# Patient Record
Sex: Female | Born: 1990 | Race: White | Hispanic: No | Marital: Single | Smoking: Never smoker
Health system: Southern US, Community
[De-identification: ages and names within clinical notes are randomized; demographics above are authoritative.]

## PROBLEM LIST (undated history)

## (undated) DIAGNOSIS — R87619 Unspecified abnormal cytological findings in specimens from cervix uteri: Secondary | ICD-10-CM

## (undated) DIAGNOSIS — K219 Gastro-esophageal reflux disease without esophagitis: Secondary | ICD-10-CM

## (undated) DIAGNOSIS — Z8719 Personal history of other diseases of the digestive system: Secondary | ICD-10-CM

## (undated) DIAGNOSIS — K3184 Gastroparesis: Secondary | ICD-10-CM

## (undated) HISTORY — DX: Personal history of other diseases of the digestive system: Z87.19

## (undated) HISTORY — DX: Unspecified abnormal cytological findings in specimens from cervix uteri: R87.619

## (undated) HISTORY — PX: CHOLECYSTECTOMY: SHX55

## (undated) HISTORY — DX: Gastro-esophageal reflux disease without esophagitis: K21.9

## (undated) HISTORY — DX: Gastroparesis: K31.84

---

## 2011-04-02 DIAGNOSIS — K811 Chronic cholecystitis: Secondary | ICD-10-CM | POA: Insufficient documentation

## 2011-04-02 DIAGNOSIS — L03317 Cellulitis of buttock: Secondary | ICD-10-CM | POA: Insufficient documentation

## 2011-04-02 DIAGNOSIS — Z9049 Acquired absence of other specified parts of digestive tract: Secondary | ICD-10-CM

## 2011-04-02 DIAGNOSIS — K589 Irritable bowel syndrome without diarrhea: Secondary | ICD-10-CM | POA: Insufficient documentation

## 2011-04-02 DIAGNOSIS — Z87410 Personal history of cervical dysplasia: Secondary | ICD-10-CM | POA: Insufficient documentation

## 2011-04-02 DIAGNOSIS — G43909 Migraine, unspecified, not intractable, without status migrainosus: Secondary | ICD-10-CM | POA: Insufficient documentation

## 2011-04-02 DIAGNOSIS — Z3400 Encounter for supervision of normal first pregnancy, unspecified trimester: Secondary | ICD-10-CM | POA: Insufficient documentation

## 2011-04-02 DIAGNOSIS — Z23 Encounter for immunization: Secondary | ICD-10-CM | POA: Insufficient documentation

## 2011-04-02 DIAGNOSIS — N10 Acute tubulo-interstitial nephritis: Secondary | ICD-10-CM | POA: Insufficient documentation

## 2011-04-02 DIAGNOSIS — K3184 Gastroparesis: Secondary | ICD-10-CM | POA: Insufficient documentation

## 2011-04-02 DIAGNOSIS — L0231 Cutaneous abscess of buttock: Secondary | ICD-10-CM | POA: Insufficient documentation

## 2011-04-02 DIAGNOSIS — R1011 Right upper quadrant pain: Secondary | ICD-10-CM | POA: Insufficient documentation

## 2011-04-02 DIAGNOSIS — E876 Hypokalemia: Secondary | ICD-10-CM | POA: Insufficient documentation

## 2011-04-02 DIAGNOSIS — N390 Urinary tract infection, site not specified: Secondary | ICD-10-CM | POA: Insufficient documentation

## 2011-04-02 DIAGNOSIS — R112 Nausea with vomiting, unspecified: Secondary | ICD-10-CM | POA: Insufficient documentation

## 2011-04-02 HISTORY — DX: Personal history of cervical dysplasia: Z87.410

## 2011-04-02 HISTORY — DX: Acquired absence of other specified parts of digestive tract: Z90.49

## 2020-06-18 DIAGNOSIS — R300 Dysuria: Secondary | ICD-10-CM | POA: Diagnosis not present

## 2020-06-18 DIAGNOSIS — N926 Irregular menstruation, unspecified: Secondary | ICD-10-CM | POA: Diagnosis not present

## 2020-06-18 DIAGNOSIS — N898 Other specified noninflammatory disorders of vagina: Secondary | ICD-10-CM | POA: Diagnosis not present

## 2020-08-11 ENCOUNTER — Other Ambulatory Visit (HOSPITAL_COMMUNITY)
Admission: RE | Admit: 2020-08-11 | Discharge: 2020-08-11 | Disposition: A | Discharge status: Home / Self Care | Payer: 59 | Source: Ambulatory Visit | Attending: Family Medicine | Admitting: Family Medicine

## 2020-08-11 ENCOUNTER — Other Ambulatory Visit: Payer: Self-pay

## 2020-08-11 ENCOUNTER — Emergency Department
Admission: RE | Admit: 2020-08-11 | Discharge: 2020-08-11 | Disposition: A | Discharge status: Home / Self Care | Payer: 59 | Source: Ambulatory Visit

## 2020-08-11 VITALS — BP 113/71 | HR 61 | Temp 98.8°F | Resp 15

## 2020-08-11 DIAGNOSIS — N760 Acute vaginitis: Secondary | ICD-10-CM

## 2020-08-11 MED ORDER — METRONIDAZOLE 500 MG PO TABS: 500.0000 mg | ORAL_TABLET | Freq: Two times a day (BID) | ORAL | 0 refills | Status: DC

## 2020-08-11 MED ORDER — FLUCONAZOLE 150 MG PO TABS: ORAL_TABLET | ORAL | 0 refills | Status: DC

## 2020-08-11 NOTE — Discharge Instructions (Addendum)
I have sent in metronidazole for you to take twice a day for 7 days.  I have sent in fluconazole in case of yeast. Take one tablet at the onset of symptoms. If symptoms are still present in 3 days, take the second tablet.   Swab will result in about 3 days.  Follow up with this office or with primary care if symptoms are persisting.  Follow up in the ER for high fever, trouble swallowing, trouble breathing, other concerning symptoms.

## 2020-08-11 NOTE — ED Triage Notes (Signed)
Self treated for yeast infection 1 week ago w/ monistat - no relief Pt noted a fishy odor - thinks it may be BV

## 2020-08-11 NOTE — ED Provider Notes (Signed)
[facility]   [id number] 08/11/20 Arrival Time: 1154   CC: VAGINAL DISCHARGE  SUBJECTIVE:  Kimberly Allison is a 30 y.o. female who presents with complaints of gradual vaginal discharge that began about 2 weeks ago. Reports that she had some vaginal itching as well. Reports that she used monistat with some symptom resolution. Reports that she cycles back and forth between BV and yeast. States that there is now a fishy odor with vaginal discharge. Denies a precipitating event, recent sexual encounter or recent antibiotic use. Patient is sexually active.  There are not aggravating or alleviating factors. Denies fever, chills, nausea, vomiting, abdominal or pelvic pain, urinary symptoms, vaginal bleeding, dyspareunia, vaginal rashes or lesions.    ROS: As per HPI.  All other pertinent ROS negative.     History reviewed. No pertinent past medical history. History reviewed. No pertinent surgical history. No Known Allergies No current facility-administered medications on file prior to encounter.   No current outpatient medications on file prior to encounter.    Social History   Socioeconomic History  . Marital status: Single    Spouse name: Not on file  . Number of children: Not on file  . Years of education: Not on file  . Highest education level: Not on file  Occupational History  . Not on file  Tobacco Use  . Smoking status: Never Smoker  . Smokeless tobacco: Never Used  Vaping Use  . Vaping Use: Never used  Substance and Sexual Activity  . Alcohol use: Not Currently  . Drug use: Never  . Sexual activity: Yes    Birth control/protection: None  Other Topics Concern  . Not on file  Social History Narrative  . Not on file   Social Determinants of Health   Financial Resource Strain: Not on file  Food Insecurity: Not on file  Transportation Needs: Not on file  Physical Activity: Not on file  Stress: Not on file  Social Connections: Not on file  Intimate  Partner Violence: Not on file   Family History  Problem Relation Age of Onset  . Healthy Mother   . Healthy Father   . Healthy Sister   . Healthy Brother   . Healthy Sister   . Healthy Sister   . Healthy Sister   . Healthy Brother     OBJECTIVE:  Vitals:   08/11/20 1207  BP: 113/71  Pulse: 61  Resp: 15  Temp: 98.8 F (37.1 C)  TempSrc: Oral  SpO2: 98%     General appearance: Alert, NAD, appears stated age Head: NCAT Throat: lips, mucosa, and tongue normal; teeth and gums normal Lungs: CTA bilaterally without adventitious breath sounds Heart: regular rate and rhythm.  Radial pulses 2+ symmetrical bilaterally Back: no CVA tenderness Abdomen: soft, non-tender; bowel sounds normal; no masses or organomegaly; no guarding or rebound tenderness GU: deferred Skin: warm and dry Psychological:  Alert and cooperative. Normal mood and affect.  LABS:  No results found for this or any previous visit.  Labs Reviewed  CERVICOVAGINAL ANCILLARY ONLY    ASSESSMENT & PLAN:  1. Acute vaginitis     Meds ordered this encounter  Medications  . metroNIDAZOLE (FLAGYL) 500 MG tablet    Sig: Take 1 tablet (500 mg total) by mouth 2 (two) times daily.    Dispense:  14 tablet    Refill:  0    Order Specific Question:   Supervising Provider    Answer:   Merrilee Jansky [id number]  .  fluconazole (DIFLUCAN) 150 MG tablet    Sig: Take one tablet at the onset of symptoms. If symptoms are still present 3 days later, take the second tablet.    Dispense:  2 tablet    Refill:  0    Order Specific Question:   Supervising Provider    Answer:   Merrilee Jansky [[id number]]    Pending: Labs Reviewed  CERVICOVAGINAL ANCILLARY ONLY    Cytology self-swab obtained.   We will follow up with you regarding abnormal results Declines HIV/ syphilis testing today Prescribed metronidazole 500 mg twice daily for 7 days (do not take while consuming alcohol and/or if breastfeeding) Prescribed  diflucan 150 mg once daily and then second dose 72 hours later Take medications as prescribed and to completion If tests results are positive, please abstain from sexual activity until you and your partner(s) have been treated Follow up with PCP or [facility] if symptoms persists Return here or go to ER if you have any new or worsening symptoms fever, chills, nausea, vomiting, abdominal or pelvic pain, painful intercourse, persistent symptoms despite treatment Reviewed expectations re: course of current medical issues. Questions answered. Outlined signs and symptoms indicating need for more acute intervention. Patient verbalized understanding. After Visit Summary given.       Moshe Cipro, NP 08/11/20 1249

## 2020-08-14 LAB — CERVICOVAGINAL ANCILLARY ONLY
Bacterial Vaginitis (gardnerella): POSITIVE — AB
Candida Glabrata: NEGATIVE
Candida Vaginitis: NEGATIVE
Chlamydia: NEGATIVE
Comment: NEGATIVE
Comment: NEGATIVE
Comment: NEGATIVE
Comment: NEGATIVE
Comment: NEGATIVE
Comment: NORMAL
Neisseria Gonorrhea: NEGATIVE
Trichomonas: NEGATIVE

## 2020-09-23 ENCOUNTER — Telehealth: Payer: 59 | Admitting: Physician Assistant

## 2020-09-23 ENCOUNTER — Encounter: Payer: Self-pay | Admitting: Physician Assistant

## 2020-09-23 DIAGNOSIS — N760 Acute vaginitis: Secondary | ICD-10-CM | POA: Diagnosis not present

## 2020-09-23 DIAGNOSIS — B9689 Other specified bacterial agents as the cause of diseases classified elsewhere: Secondary | ICD-10-CM

## 2020-09-23 MED ORDER — METRONIDAZOLE 500 MG PO TABS: 500.0000 mg | ORAL_TABLET | Freq: Two times a day (BID) | ORAL | 0 refills | Status: DC

## 2020-09-23 NOTE — Progress Notes (Signed)
We are sorry that you are not feeling well. Here is how we plan to help! Based on what you shared with me it looks like you: May have a vaginosis due to bacteria  Vaginosis is an inflammation of the vagina that can result in discharge, itching and pain. The cause is usually a change in the normal balance of vaginal bacteria or an infection. Vaginosis can also result from reduced estrogen levels after menopause.  The most common causes of vaginosis are:   Bacterial vaginosis which results from an overgrowth of one on several organisms that are normally present in your vagina.   Yeast infections which are caused by a naturally occurring fungus called candida.   Vaginal atrophy (atrophic vaginosis) which results from the thinning of the vagina from reduced estrogen levels after menopause.   Trichomoniasis which is caused by a parasite and is commonly transmitted by sexual intercourse.  Factors that increase your risk of developing vaginosis include: Marland Kitchen Medications, such as antibiotics and steroids . Uncontrolled diabetes . Use of hygiene products such as bubble bath, vaginal spray or vaginal deodorant . Douching . Wearing damp or tight-fitting clothing . Using an intrauterine device (IUD) for birth control . Hormonal changes, such as those associated with pregnancy, birth control pills or menopause . Sexual activity . Having a sexually transmitted infection  Your treatment plan is Metronidazole or Flagyl 500mg  twice a day for 7 days.  I have electronically sent this prescription into the pharmacy that you have chosen.   To lessen the side effects of antibiotic and hopefully prevent yeast infection, I recommend that you take a daily probiotic while on the antibiotic. We will not treat empirically for yeast infection, but if you develop any symptoms, you may take OTC medication for yeast infection and if symptoms persist, submit an Evisit or follow up with your doctor.   Be sure to take all  of the medication as directed. Stop taking any medication if you develop a rash, tongue swelling or shortness of breath. Mothers who are breast feeding should consider pumping and discarding their breast milk while on these antibiotics. However, there is no consensus that infant exposure at these doses would be harmful.  Remember that medication creams can weaken latex condoms.   HOME CARE:  Good hygiene may prevent some types of vaginosis from recurring and may relieve some symptoms:  . Avoid baths, hot tubs and whirlpool spas. Rinse soap from your outer genital area after a shower, and dry the area well to prevent irritation. Don't use scented or harsh soaps, such as those with deodorant or antibacterial action. Marland Kitchen Avoid irritants. These include scented tampons and pads. . Wipe from front to back after using the toilet. Doing so avoids spreading fecal bacteria to your vagina.  Other things that may help prevent vaginosis include:  Marland Kitchen Don't douche. Your vagina doesn't require cleansing other than normal bathing. Repetitive douching disrupts the normal organisms that reside in the vagina and can actually increase your risk of vaginal infection. Douching won't clear up a vaginal infection. . Use a latex condom. Both female and female latex condoms may help you avoid infections spread by sexual contact. . Wear cotton underwear. Also wear pantyhose with a cotton crotch. If you feel comfortable without it, skip wearing underwear to bed. Yeast thrives in Marland Kitchen Your symptoms should improve in the next day or two.  GET HELP RIGHT AWAY IF:  . You have pain in your lower abdomen ( pelvic  area or over your ovaries) . You develop nausea or vomiting . You develop a fever . Your discharge changes or worsens . You have persistent pain with intercourse . You develop shortness of breath, a rapid pulse, or you faint.  These symptoms could be signs of problems or infections that need to be  evaluated by a medical provider now.  MAKE SURE YOU    Understand these instructions.  Will watch your condition.  Will get help right away if you are not doing well or get worse.  Your e-visit answers were reviewed by a board certified advanced clinical practitioner to complete your personal care plan. Depending upon the condition, your plan could have included both over the counter or prescription medications. Please review your pharmacy choice to make sure that you have choses a pharmacy that is open for you to pick up any needed prescription, Your safety is important to [place]. If you have drug allergies check your prescription carefully.   You can use MyChart to ask questions about today's visit, request a non-urgent call back, or ask for a work or school excuse for 24 hours related to this e-Visit. If it has been greater than 24 hours you will need to follow up with your provider, or enter a new e-Visit to address those concerns. You will get a MyChart message within the next two days asking about your experience. I hope that your e-visit has been valuable and will speed your recovery. I spent 5-10 minutes on review and completion of this note- [facility]

## 2020-10-08 ENCOUNTER — Other Ambulatory Visit: Payer: Self-pay

## 2020-10-09 ENCOUNTER — Encounter: Payer: Self-pay | Admitting: Medical-Surgical

## 2020-10-09 ENCOUNTER — Other Ambulatory Visit: Payer: Self-pay

## 2020-10-09 ENCOUNTER — Ambulatory Visit: Payer: 59 | Admitting: Medical-Surgical

## 2020-10-09 VITALS — BP 99/66 | HR 82 | Temp 98.7°F | Ht 68.25 in | Wt 199.2 lb

## 2020-10-09 DIAGNOSIS — N898 Other specified noninflammatory disorders of vagina: Secondary | ICD-10-CM | POA: Diagnosis not present

## 2020-10-09 DIAGNOSIS — Z30019 Encounter for initial prescription of contraceptives, unspecified: Secondary | ICD-10-CM

## 2020-10-09 DIAGNOSIS — Z7689 Persons encountering health services in other specified circumstances: Secondary | ICD-10-CM

## 2020-10-09 LAB — POCT URINE PREGNANCY: Preg Test, Ur: NEGATIVE

## 2020-10-09 MED ORDER — FLUCONAZOLE 150 MG PO TABS: 150.0000 mg | ORAL_TABLET | Freq: Once | ORAL | 0 refills | Status: AC

## 2020-10-09 MED ORDER — MEDROXYPROGESTERONE ACETATE 150 MG/ML IM SUSP
150.0000 mg | Freq: Once | INTRAMUSCULAR | Status: AC
Start: 2020-10-09 — End: 2020-10-09
  Administered 2020-10-09: 150 mg via INTRAMUSCULAR

## 2020-10-09 NOTE — Progress Notes (Signed)
New Patient Office Visit  Subjective:  Patient ID: Kimberly Allison, female    DOB: 1991/04/23  Age: 30 y.o. MRN: [id number]  CC:  Chief Complaint  Patient presents with  . Establish Care    HPI [name] presents to establish care.  Was recently evaluated for vaginal discharge symptoms.  Was prescribed Flagyl for bacterial vaginosis which she completed as prescribed.  Now she notes her discharge has changed to thick and white and she has developed vaginal itching.  Notes that she does get BV approximately 3 times a year and she always needs Diflucan to treat the yeast infection that she gets after taking Flagyl.  She was taking probiotics and eating yogurt but this did not seem to help.  Birth control-would like to get restarted on Depo-Provera for birth control.  She has done this in the past and tolerated it very well.  She did take 6-year break from Depo-Provera and had the Nexplanon instead.  Unfortunately, when they removed her last Nexplanon, it took almost an hour to get out due to scar tissue.  She is currently sexually active with female partners and uses condoms the majority of the time.  She has been sexually active without a condom since her last menstrual cycle.  History reviewed. No pertinent past medical history.  History reviewed. No pertinent surgical history.  Family History  Problem Relation Age of Onset  . Healthy Mother   . Healthy Father   . Healthy Sister   . Healthy Brother   . Healthy Sister   . Healthy Sister   . Healthy Sister   . Healthy Brother     Social History   Socioeconomic History  . Marital status: Single    Spouse name: Not on file  . Number of children: Not on file  . Years of education: Not on file  . Highest education level: Not on file  Occupational History  . Not on file  Tobacco Use  . Smoking status: Never Smoker  . Smokeless tobacco: Never Used  Vaping Use  . Vaping Use: Never used  Substance and Sexual Activity   . Alcohol use: Yes    Comment: occasionally  . Drug use: Never  . Sexual activity: Yes    Birth control/protection: None  Other Topics Concern  . Not on file  Social History Narrative  . Not on file   Social Determinants of Health   Financial Resource Strain: Not on file  Food Insecurity: Not on file  Transportation Needs: Not on file  Physical Activity: Not on file  Stress: Not on file  Social Connections: Not on file  Intimate Partner Violence: Not on file    ROS Review of Systems  Constitutional: Negative for chills, fatigue, fever and unexpected weight change.  Respiratory: Negative for cough, chest tightness, shortness of breath and wheezing.   Cardiovascular: Negative for chest pain, palpitations and leg swelling.  Gastrointestinal: Negative for abdominal pain, constipation, diarrhea, nausea and vomiting.  Genitourinary: Negative for dysuria, frequency and urgency.  Neurological: Negative for dizziness, light-headedness and headaches.  Psychiatric/Behavioral: Negative for dysphoric mood, self-injury, sleep disturbance and suicidal ideas. The patient is not nervous/anxious.    Objective:   Today's Vitals: BP 99/66   Pulse 82   Temp 98.7 F (37.1 C)   Ht 5' 8.25" (1.734 m)   Wt 199 lb 3.2 oz (90.4 kg)   LMP 09/10/2020   SpO2 95%   BMI 30.07 kg/m   Physical Exam Vitals  reviewed.  Constitutional:      General: She is not in acute distress.    Appearance: Normal appearance.  HENT:     Head: Normocephalic and atraumatic.  Cardiovascular:     Rate and Rhythm: Normal rate and regular rhythm.     Pulses: Normal pulses.     Heart sounds: Normal heart sounds. No murmur heard. No friction rub. No gallop.   Pulmonary:     Effort: Pulmonary effort is normal. No respiratory distress.     Breath sounds: Normal breath sounds. No wheezing.  Skin:    General: Skin is warm and dry.  Neurological:     Mental Status: She is alert and oriented to person, place, and  time.  Psychiatric:        Mood and Affect: Mood normal.        Behavior: Behavior normal.        Thought Content: Thought content normal.        Judgment: Judgment normal.     Assessment & Plan:   1. Encounter to establish care Reviewed available information and discussed healthcare concerns with patient.  She is due for an annual physical exam as well as an update on her Pap smear.  2. Vaginal discharge Symptoms consistent with VBC.  Sending in Diflucan 150 mg x 1.  3. Encounter for female birth control Due for her menses now.  Pregnancy test negative.  Discussed risks versus benefits of starting Depo-Provera without waiting for her menses to start.  Patient would like to go ahead and proceed so Depo-Provera 150 mg IM x1 given today. - POCT urine pregnancy - medroxyPROGESTERone (DEPO-PROVERA) injection 150 mg   Outpatient Encounter Medications as of 10/09/2020  Medication Sig  . fluconazole (DIFLUCAN) 150 MG tablet Take 1 tablet (150 mg total) by mouth once for 1 dose.  . [DISCONTINUED] fluconazole (DIFLUCAN) 150 MG tablet Take one tablet at the onset of symptoms. If symptoms are still present 3 days later, take the second tablet.  . [DISCONTINUED] metroNIDAZOLE (FLAGYL) 500 MG tablet Take 1 tablet (500 mg total) by mouth 2 (two) times daily.  . [EXPIRED] medroxyPROGESTERone (DEPO-PROVERA) injection 150 mg    No facility-administered encounter medications on file as of 10/09/2020.    Follow-up: Return for next Depo-Provera injection in 12-14 weeks, annual physical exam at your convenience.   Thayer Ohm, DNP, APRN, FNP-BC [facility]

## 2020-10-15 ENCOUNTER — Other Ambulatory Visit (HOSPITAL_COMMUNITY): Payer: Self-pay

## 2020-10-18 ENCOUNTER — Other Ambulatory Visit: Payer: Self-pay

## 2020-10-18 ENCOUNTER — Ambulatory Visit (INDEPENDENT_AMBULATORY_CARE_PROVIDER_SITE_OTHER): Payer: 59 | Admitting: Medical-Surgical

## 2020-10-18 ENCOUNTER — Other Ambulatory Visit (HOSPITAL_COMMUNITY)
Admission: RE | Admit: 2020-10-18 | Discharge: 2020-10-18 | Disposition: A | Discharge status: Home / Self Care | Payer: 59 | Source: Ambulatory Visit | Attending: Medical-Surgical | Admitting: Medical-Surgical

## 2020-10-18 ENCOUNTER — Encounter: Payer: Self-pay | Admitting: Medical-Surgical

## 2020-10-18 VITALS — BP 122/69 | HR 64 | Temp 98.7°F | Ht 67.25 in | Wt 198.3 lb

## 2020-10-18 DIAGNOSIS — Z0000 Encounter for general adult medical examination without abnormal findings: Secondary | ICD-10-CM

## 2020-10-18 DIAGNOSIS — Z124 Encounter for screening for malignant neoplasm of cervix: Secondary | ICD-10-CM

## 2020-10-18 DIAGNOSIS — Z1329 Encounter for screening for other suspected endocrine disorder: Secondary | ICD-10-CM | POA: Diagnosis not present

## 2020-10-18 NOTE — Progress Notes (Signed)
HPI: Kimberly Allison is a 30 y.o. female who  has a past medical history of Abnormal Pap smear of cervix, Gastroparesis, GERD (gastroesophageal reflux disease), and History of diverticulitis.  she presents to [facility] today, 10/18/20,  for chief complaint of: Annual physical exam  Dentist: every 6 months, no concerns Eye exam: UTD, glasses and contacts Exercise: some intentional exercise (walking, beachbody) Diet: no special diet Pap smear: doing today COVID vaccine: done, boosted  Concerns:  None  Past medical, surgical, social and family history reviewed:  Patient Active Problem List   Diagnosis Date Noted  . Gastroparesis 04/02/2011  . History of cholecystectomy 04/02/2011  . Irritable bowel syndrome 04/02/2011  . History of cervical dysplasia 04/02/2011    Past Surgical History:  Procedure Laterality Date  . CHOLECYSTECTOMY      Social History   Tobacco Use  . Smoking status: Never Smoker  . Smokeless tobacco: Never Used  Substance Use Topics  . Alcohol use: Yes    Comment: occasionally    Family History  Problem Relation Age of Onset  . Healthy Mother   . Healthy Father   . Healthy Sister   . Healthy Brother   . Healthy Sister   . Healthy Sister   . Healthy Sister   . Healthy Brother   . Hypertension Other      Current medication list and allergy/intolerance information reviewed:    No current outpatient medications on file.   No current facility-administered medications for this visit.    Allergies  Allergen Reactions  . Tape Rash    BANDAIDS      Review of Systems:  Constitutional:  No  fever, no chills, No recent illness, No unintentional weight changes. No significant fatigue.   HEENT: No  headache, no vision change, no hearing change, No sore throat, No  sinus pressure  Cardiac: No  chest pain, No  pressure, No palpitations, No  Orthopnea  Respiratory:  No  shortness of breath. No   Cough  Gastrointestinal: No  abdominal pain, No  nausea, No  vomiting,  No  blood in stool, No  diarrhea, No  constipation   Musculoskeletal: No new myalgia/arthralgia  Skin: No  Rash, No other wounds/concerning lesions  Genitourinary: No  incontinence, No  abnormal genital bleeding, No abnormal genital discharge  Hem/Onc: No  easy bruising/bleeding, No  abnormal lymph node  Endocrine: No cold intolerance,  No heat intolerance. No polyuria/polydipsia/polyphagia   Neurologic: No  weakness, No  dizziness, No  slurred speech/focal weakness/facial droop  Psychiatric: No  concerns with depression, No  concerns with anxiety, No sleep problems, No mood problems  Exam:  BP 122/69   Pulse 64   Temp 98.7 F (37.1 C)   Ht 5' 7.25" (1.708 m)   Wt 198 lb 4.8 oz (89.9 kg)   LMP 10/09/2020   SpO2 97%   BMI 30.83 kg/m   Constitutional: VS see above. General Appearance: alert, well-developed, well-nourished, NAD  Eyes: Normal lids and conjunctive, non-icteric sclera  Ears, Nose, Mouth, Throat: MMM, Normal external inspection ears/nares/mouth/lips/gums. TM normal bilaterally.  Neck: No masses, trachea midline. No thyroid enlargement. No tenderness/mass appreciated. No lymphadenopathy  Respiratory: Normal respiratory effort. no wheeze, no rhonchi, no rales  Cardiovascular: S1/S2 normal, no murmur, no rub/gallop auscultated. RRR. No lower extremity edema. Pedal pulse II/IV bilaterally PT. No carotid bruit or JVD. No abdominal aortic bruit.  Gastrointestinal: Nontender, no masses. No hepatomegaly, no splenomegaly. No hernia appreciated.  Bowel sounds normal. Rectal exam deferred.   Musculoskeletal: Gait normal. No clubbing/cyanosis of digits.   Neurological: Normal balance/coordination. No tremor. No cranial nerve deficit on limited exam. Motor and sensation intact and symmetric. Cerebellar reflexes intact.   Skin: warm, dry, intact. No rash/ulcer. No concerning nevi or subq nodules on  limited exam.    Psychiatric: Normal judgment/insight. Normal mood and affect. Oriented x3.    No results found for this or any previous visit (from the past 72 hour(s)).  No results found.   ASSESSMENT/PLAN:   1. Annual physical exam Checking CBC, CMP, and lipid panel today.  - CBC - COMPLETE METABOLIC PANEL WITH GFR - Lipid panel  2. Screening for cervical cancer Pap smear completed today.  - Cytology - PAP  3. Screening for endocrine disorder Checking TSH. - TSH  Orders Placed This Encounter  Procedures  . CBC  . COMPLETE METABOLIC PANEL WITH GFR  . Lipid panel  . TSH    No orders of the defined types were placed in this encounter.   Patient Instructions   Preventive Care 51-98 Years Old, Female Preventive care refers to lifestyle choices and visits with your health care provider that can promote health and wellness. This includes:  A yearly physical exam. This is also called an annual wellness visit.  Regular dental and eye exams.  Immunizations.  Screening for certain conditions.  Healthy lifestyle choices, such as: ? Eating a healthy diet. ? Getting regular exercise. ? Not using drugs or products that contain nicotine and tobacco. ? Limiting alcohol use. What can I expect for my preventive care visit? Physical exam Your health care provider may check your:  Height and weight. These may be used to calculate your BMI (body mass index). BMI is a measurement that tells if you are at a healthy weight.  Heart rate and blood pressure.  Body temperature.  Skin for abnormal spots. Counseling Your health care provider may ask you questions about your:  Past medical problems.  Family's medical history.  Alcohol, tobacco, and drug use.  Emotional well-being.  Home life and relationship well-being.  Sexual activity.  Diet, exercise, and sleep habits.  Work and work Statistician.  Access to firearms.  Method of birth control.  Menstrual  cycle.  Pregnancy history. What immunizations do I need? Vaccines are usually given at various ages, according to a schedule. Your health care provider will recommend vaccines for you based on your age, medical history, and lifestyle or other factors, such as travel or where you work.   What tests do I need? Blood tests  Lipid and cholesterol levels. These may be checked every 5 years starting at age 70.  Hepatitis C test.  Hepatitis B test. Screening  Diabetes screening. This is done by checking your blood sugar (glucose) after you have not eaten for a while (fasting).  STD (sexually transmitted disease) testing, if you are at risk.  BRCA-related cancer screening. This may be done if you have a family history of breast, ovarian, tubal, or peritoneal cancers.  Pelvic exam and Pap test. This may be done every 3 years starting at age 22. Starting at age 42, this may be done every 5 years if you have a Pap test in combination with an HPV test. Talk with your health care provider about your test results, treatment options, and if necessary, the need for more tests.   Follow these instructions at home: Eating and drinking  Eat a healthy diet that includes  fresh fruits and vegetables, whole grains, lean protein, and low-fat dairy products.  Take vitamin and mineral supplements as recommended by your health care provider.  Do not drink alcohol if: ? Your health care provider tells you not to drink. ? You are pregnant, may be pregnant, or are planning to become pregnant.  If you drink alcohol: ? Limit how much you have to 0-1 drink a day. ? Be aware of how much alcohol is in your drink. In the [place] one drink equals one 12 oz bottle of beer (355 mL), one 5 oz glass of wine (148 mL), or one 1 oz glass of hard liquor (44 mL).   Lifestyle  Take daily care of your teeth and gums. Brush your teeth every morning and night with fluoride toothpaste. Floss one time each day.  Stay active.  Exercise for at least 30 minutes 5 or more days each week.  Do not use any products that contain nicotine or tobacco, such as cigarettes, e-cigarettes, and chewing tobacco. If you need help quitting, ask your health care provider.  Do not use drugs.  If you are sexually active, practice safe sex. Use a condom or other form of protection to prevent STIs (sexually transmitted infections).  If you do not wish to become pregnant, use a form of birth control. If you plan to become pregnant, see your health care provider for a prepregnancy visit.  Find healthy ways to cope with stress, such as: ? Meditation, yoga, or listening to music. ? Journaling. ? Talking to a trusted person. ? Spending time with friends and family. Safety  Always wear your seat belt while driving or riding in a vehicle.  Do not drive: ? If you have been drinking alcohol. Do not ride with someone who has been drinking. ? When you are tired or distracted. ? While texting.  Wear a helmet and other protective equipment during sports activities.  If you have firearms in your house, make sure you follow all gun safety procedures.  Seek help if you have been physically or sexually abused. What's next?  Go to your health care provider once a year for an annual wellness visit.  Ask your health care provider how often you should have your eyes and teeth checked.  Stay up to date on all vaccines. This information is not intended to replace advice given to you by your health care provider. Make sure you discuss any questions you have with your health care provider. Document Revised: 01/22/2020 Document Reviewed: 02/04/2018 Elsevier Patient Education  2021 [place].   Follow-up plan: Return for Depo shot when due (last given 5/3).  Clearnce Sorrel, DNP, APRN, FNP-BC [facility]

## 2020-10-18 NOTE — Patient Instructions (Signed)
Preventive Care 21-30 Years Old, Female Preventive care refers to lifestyle choices and visits with your health care provider that can promote health and wellness. This includes:  A yearly physical exam. This is also called an annual wellness visit.  Regular dental and eye exams.  Immunizations.  Screening for certain conditions.  Healthy lifestyle choices, such as: ? Eating a healthy diet. ? Getting regular exercise. ? Not using drugs or products that contain nicotine and tobacco. ? Limiting alcohol use. What can I expect for my preventive care visit? Physical exam Your health care provider may check your:  Height and weight. These may be used to calculate your BMI (body mass index). BMI is a measurement that tells if you are at a healthy weight.  Heart rate and blood pressure.  Body temperature.  Skin for abnormal spots. Counseling Your health care provider may ask you questions about your:  Past medical problems.  Family's medical history.  Alcohol, tobacco, and drug use.  Emotional well-being.  Home life and relationship well-being.  Sexual activity.  Diet, exercise, and sleep habits.  Work and work environment.  Access to firearms.  Method of birth control.  Menstrual cycle.  Pregnancy history. What immunizations do I need? Vaccines are usually given at various ages, according to a schedule. Your health care provider will recommend vaccines for you based on your age, medical history, and lifestyle or other factors, such as travel or where you work.   What tests do I need? Blood tests  Lipid and cholesterol levels. These may be checked every 5 years starting at age 20.  Hepatitis C test.  Hepatitis B test. Screening  Diabetes screening. This is done by checking your blood sugar (glucose) after you have not eaten for a while (fasting).  STD (sexually transmitted disease) testing, if you are at risk.  BRCA-related cancer screening. This may be  done if you have a family history of breast, ovarian, tubal, or peritoneal cancers.  Pelvic exam and Pap test. This may be done every 3 years starting at age 21. Starting at age 30, this may be done every 5 years if you have a Pap test in combination with an HPV test. Talk with your health care provider about your test results, treatment options, and if necessary, the need for more tests.   Follow these instructions at home: Eating and drinking  Eat a healthy diet that includes fresh fruits and vegetables, whole grains, lean protein, and low-fat dairy products.  Take vitamin and mineral supplements as recommended by your health care provider.  Do not drink alcohol if: ? Your health care provider tells you not to drink. ? You are pregnant, may be pregnant, or are planning to become pregnant.  If you drink alcohol: ? Limit how much you have to 0-1 drink a day. ? Be aware of how much alcohol is in your drink. In the [place] one drink equals one 12 oz bottle of beer (355 mL), one 5 oz glass of wine (148 mL), or one 1 oz glass of hard liquor (44 mL).   Lifestyle  Take daily care of your teeth and gums. Brush your teeth every morning and night with fluoride toothpaste. Floss one time each day.  Stay active. Exercise for at least 30 minutes 5 or more days each week.  Do not use any products that contain nicotine or tobacco, such as cigarettes, e-cigarettes, and chewing tobacco. If you need help quitting, ask your health care provider.  Do not   use drugs.  If you are sexually active, practice safe sex. Use a condom or other form of protection to prevent STIs (sexually transmitted infections).  If you do not wish to become pregnant, use a form of birth control. If you plan to become pregnant, see your health care provider for a prepregnancy visit.  Find healthy ways to cope with stress, such as: ? Meditation, yoga, or listening to music. ? Journaling. ? Talking to a trusted  person. ? Spending time with friends and family. Safety  Always wear your seat belt while driving or riding in a vehicle.  Do not drive: ? If you have been drinking alcohol. Do not ride with someone who has been drinking. ? When you are tired or distracted. ? While texting.  Wear a helmet and other protective equipment during sports activities.  If you have firearms in your house, make sure you follow all gun safety procedures.  Seek help if you have been physically or sexually abused. What's next?  Go to your health care provider once a year for an annual wellness visit.  Ask your health care provider how often you should have your eyes and teeth checked.  Stay up to date on all vaccines. This information is not intended to replace advice given to you by your health care provider. Make sure you discuss any questions you have with your health care provider. Document Revised: 01/22/2020 Document Reviewed: 02/04/2018 Elsevier Patient Education  2021 [employer].

## 2020-10-19 LAB — CBC
HCT: 40.6 % (ref 35.0–45.0)
Hemoglobin: 14 g/dL (ref 11.7–15.5)
MCH: 31.5 pg (ref 27.0–33.0)
MCHC: 34.5 g/dL (ref 32.0–36.0)
MCV: 91.2 fL (ref 80.0–100.0)
MPV: 12.1 fL (ref 7.5–12.5)
Platelets: 234 10*3/uL (ref 140–400)
RBC: 4.45 10*6/uL (ref 3.80–5.10)
RDW: 12 % (ref 11.0–15.0)
WBC: 9.6 10*3/uL (ref 3.8–10.8)

## 2020-10-19 LAB — TSH: TSH: 0.97 mIU/L

## 2020-10-19 LAB — LIPID PANEL
Cholesterol: 164 mg/dL (ref <200)
HDL: 45 mg/dL — ABNORMAL LOW (ref >=50)
LDL Cholesterol (Calc): 97 mg/dL (calc)
Non-HDL Cholesterol (Calc): 119 mg/dL (calc) (ref <130)
Total CHOL/HDL Ratio: 3.6 (calc) (ref <5.0)
Triglycerides: 120 mg/dL (ref <150)

## 2020-10-19 LAB — COMPLETE METABOLIC PANEL WITH GFR
AG Ratio: 1.9 (calc) (ref 1.0–2.5)
ALT: 20 U/L (ref 6–29)
AST: 15 U/L (ref 10–30)
Albumin: 4.4 g/dL (ref 3.6–5.1)
Alkaline phosphatase (APISO): 42 U/L (ref 31–125)
BUN: 11 mg/dL (ref 7–25)
CO2: 24 mmol/L (ref 20–32)
Calcium: 9.5 mg/dL (ref 8.6–10.2)
Chloride: 109 mmol/L (ref 98–110)
Creat: 0.62 mg/dL (ref 0.50–1.10)
GFR, Est African American: 141 mL/min/{1.73_m2} (ref >=60)
GFR, Est Non African American: 122 mL/min/{1.73_m2} (ref >=60)
Globulin: 2.3 g/dL (calc) (ref 1.9–3.7)
Glucose, Bld: 61 mg/dL — ABNORMAL LOW (ref 65–139)
Potassium: 4.2 mmol/L (ref 3.5–5.3)
Sodium: 141 mmol/L (ref 135–146)
Total Bilirubin: 0.4 mg/dL (ref 0.2–1.2)
Total Protein: 6.7 g/dL (ref 6.1–8.1)

## 2020-10-22 LAB — CYTOLOGY - PAP
Diagnosis: NEGATIVE
Diagnosis: REACTIVE

## 2020-10-27 ENCOUNTER — Telehealth: Payer: 59 | Admitting: Physician Assistant

## 2020-10-27 DIAGNOSIS — R3989 Other symptoms and signs involving the genitourinary system: Secondary | ICD-10-CM | POA: Diagnosis not present

## 2020-10-27 MED ORDER — SULFAMETHOXAZOLE-TRIMETHOPRIM 800-160 MG PO TABS: 1.0000 | ORAL_TABLET | Freq: Two times a day (BID) | ORAL | 0 refills | Status: DC

## 2020-10-27 NOTE — Progress Notes (Signed)
We are sorry that you are not feeling well.  Here is how we plan to help!  Based on what you shared with me it looks like you most likely have a simple urinary tract infection.  A UTI (Urinary Tract Infection) is a bacterial infection of the bladder.  Most cases of urinary tract infections are simple to treat but a key part of your care is to encourage you to drink plenty of fluids and watch your symptoms carefully.  I have prescribed Bactrim DS One tablet twice a day for 5 days.  Your symptoms should gradually improve. Call us if the burning in your urine worsens, you develop worsening fever, back pain or pelvic pain or if your symptoms do not resolve after completing the antibiotic.  Urinary tract infections can be prevented by drinking plenty of water to keep your body hydrated.  Also be sure when you wipe, wipe from front to back and don't hold it in!  If possible, empty your bladder every 4 hours.  Your e-visit answers were reviewed by a board certified advanced clinical practitioner to complete your personal care plan.  Depending on the condition, your plan could have included both over the counter or prescription medications.  If there is a problem please reply  once you have received a response from your provider.  Your safety is important to us.  If you have drug allergies check your prescription carefully.    You can use MyChart to ask questions about today's visit, request a non-urgent call back, or ask for a work or school excuse for 24 hours related to this e-Visit. If it has been greater than 24 hours you will need to follow up with your provider, or enter a new e-Visit to address those concerns.   You will get an e-mail in the next two days asking about your experience.  I hope that your e-visit has been valuable and will speed your recovery. Thank you for using e-visits.  I provided 5 minutes of non face-to-face time during this encounter for chart review and documentation.    

## 2020-11-04 ENCOUNTER — Emergency Department (HOSPITAL_COMMUNITY)
Admission: EM | Admit: 2020-11-04 | Discharge: 2020-11-04 | Disposition: A | Discharge status: Home / Self Care | Payer: 59 | Attending: Emergency Medicine | Admitting: Emergency Medicine

## 2020-11-04 ENCOUNTER — Encounter (HOSPITAL_COMMUNITY): Payer: Self-pay

## 2020-11-04 ENCOUNTER — Emergency Department (HOSPITAL_COMMUNITY): Payer: 59

## 2020-11-04 DIAGNOSIS — S61512A Laceration without foreign body of left wrist, initial encounter: Secondary | ICD-10-CM | POA: Insufficient documentation

## 2020-11-04 DIAGNOSIS — X58XXXA Exposure to other specified factors, initial encounter: Secondary | ICD-10-CM | POA: Diagnosis not present

## 2020-11-04 DIAGNOSIS — R69 Illness, unspecified: Secondary | ICD-10-CM | POA: Diagnosis not present

## 2020-11-04 DIAGNOSIS — S6992XA Unspecified injury of left wrist, hand and finger(s), initial encounter: Secondary | ICD-10-CM | POA: Diagnosis present

## 2020-11-04 DIAGNOSIS — M545 Low back pain, unspecified: Secondary | ICD-10-CM | POA: Diagnosis not present

## 2020-11-04 DIAGNOSIS — T63444A Toxic effect of venom of bees, undetermined, initial encounter: Secondary | ICD-10-CM | POA: Diagnosis not present

## 2020-11-04 DIAGNOSIS — Y92410 Unspecified street and highway as the place of occurrence of the external cause: Secondary | ICD-10-CM | POA: Insufficient documentation

## 2020-11-04 DIAGNOSIS — Z041 Encounter for examination and observation following transport accident: Secondary | ICD-10-CM | POA: Diagnosis not present

## 2020-11-04 DIAGNOSIS — S3992XA Unspecified injury of lower back, initial encounter: Secondary | ICD-10-CM | POA: Insufficient documentation

## 2020-11-04 DIAGNOSIS — M542 Cervicalgia: Secondary | ICD-10-CM | POA: Diagnosis not present

## 2020-11-04 DIAGNOSIS — Y999 Unspecified external cause status: Secondary | ICD-10-CM | POA: Diagnosis not present

## 2020-11-04 MED ORDER — METHOCARBAMOL 500 MG PO TABS: 500.0000 mg | ORAL_TABLET | Freq: Two times a day (BID) | ORAL | 0 refills | Status: DC

## 2020-11-04 MED ORDER — NAPROXEN 375 MG PO TABS: 375.0000 mg | ORAL_TABLET | Freq: Two times a day (BID) | ORAL | 0 refills | Status: DC

## 2020-11-04 MED ORDER — ACETAMINOPHEN 325 MG PO TABS
650.0000 mg | ORAL_TABLET | Freq: Once | ORAL | Status: AC
  Administered 2020-11-04: 650 mg via ORAL
  Filled 2020-11-04: qty 2

## 2020-11-04 NOTE — Discharge Instructions (Signed)
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
## 2020-11-11 DIAGNOSIS — Z20822 Contact with and (suspected) exposure to covid-19: Secondary | ICD-10-CM | POA: Diagnosis not present

## 2020-11-20 ENCOUNTER — Other Ambulatory Visit: Payer: Self-pay

## 2020-11-20 ENCOUNTER — Encounter: Payer: Self-pay | Admitting: Medical-Surgical

## 2020-11-20 ENCOUNTER — Ambulatory Visit: Payer: 59 | Admitting: Medical-Surgical

## 2020-11-20 VITALS — BP 118/70 | HR 75 | Temp 97.9°F | Ht 67.25 in | Wt 196.0 lb

## 2020-11-20 DIAGNOSIS — N898 Other specified noninflammatory disorders of vagina: Secondary | ICD-10-CM

## 2020-11-20 DIAGNOSIS — R300 Dysuria: Secondary | ICD-10-CM | POA: Diagnosis not present

## 2020-11-20 LAB — POCT URINALYSIS DIP (CLINITEK)
Bilirubin, UA: NEGATIVE
Blood, UA: NEGATIVE
Glucose, UA: NEGATIVE mg/dL
Ketones, POC UA: NEGATIVE mg/dL
Leukocytes, UA: NEGATIVE
Nitrite, UA: NEGATIVE
POC PROTEIN,UA: NEGATIVE
Spec Grav, UA: 1.025 (ref 1.010–1.025)
Urobilinogen, UA: 0.2 E.U./dL
pH, UA: 5 (ref 5.0–8.0)

## 2020-11-20 LAB — WET PREP FOR TRICH, YEAST, CLUE
MICRO NUMBER:: 12005031
Specimen Quality: ADEQUATE

## 2020-11-20 NOTE — Progress Notes (Signed)
  Subjective:    CC: Vaginal discharge  HPI: Pleasant 30 year old female presenting with complaints of vaginal discharge with mild external itching and a slight odor change.  Notes her symptoms started approximately 2 weeks ago and she was concerned since she had recent antibiotics.  Her discharge is thin and white.  Found it hard to describe the odor but thinks it may be leaning toward that she.  She has a history of BV as well as yeast a couple of times a year.  She is sexually active with 1 female partner and they use condoms.  She is requesting to be tested for STIs.  I reviewed the past medical history, family history, social history, surgical history, and allergies today and no changes were needed.  Please see the problem list section below in epic for further details.  Past Medical History: Past Medical History:  Diagnosis Date   Abnormal Pap smear of cervix    Gastroparesis    GERD (gastroesophageal reflux disease)    History of diverticulitis    Past Surgical History: Past Surgical History:  Procedure Laterality Date   CHOLECYSTECTOMY     Social History: Social History   Socioeconomic History   Marital status: Single    Spouse name: Not on file   Number of children: Not on file   Years of education: Not on file   Highest education level: Not on file  Occupational History   Not on file  Tobacco Use   Smoking status: Never   Smokeless tobacco: Never  Vaping Use   Vaping Use: Never used  Substance and Sexual Activity   Alcohol use: Yes    Comment: occasionally   Drug use: Never   Sexual activity: Yes    Birth control/protection: None  Other Topics Concern   Not on file  Social History Narrative   Not on file   Social Determinants of Health   Financial Resource Strain: Not on file  Food Insecurity: Not on file  Transportation Needs: Not on file  Physical Activity: Not on file  Stress: Not on file  Social Connections: Not on file   Family History: Family  History  Problem Relation Age of Onset   Healthy Mother    Healthy Father    Healthy Sister    Healthy Brother    Healthy Sister    Healthy Sister    Healthy Sister    Healthy Brother    Hypertension Other    Allergies: Allergies  Allergen Reactions   Tape Rash    BANDAIDS   Medications: See med rec.  Review of Systems: See HPI for pertinent positives and negatives.   Objective:    General: Well Developed, well nourished, and in no acute distress.  Neuro: Alert and oriented x3.  HEENT: Normocephalic, atraumatic.  Skin: Warm and dry. Cardiac: Regular rate and rhythm, no murmurs rubs or gallops, no lower extremity edema.  Respiratory: Clear to auscultation bilaterally. Not using accessory muscles, speaking in full sentences.  Impression and Recommendations:    1. Dysuria 2. Vaginal discharge POCT urinalysis negative.  Sending urine for gonorrhea/chlamydia.  Self swab wet prep to evaluate for trichomonas, and BV.   - POCT URINALYSIS DIP (CLINITEK) - Urine Culture - WET PREP FOR TRICH, YEAST, CLUE - C. trachomatis/N. gonorrhoeae RNA  Return if symptoms worsen or fail to improve. ___________________________________________ Thayer Ohm, DNP, APRN, FNP-BC Primary Care and Sports Medicine [facility] [place]

## 2020-11-21 LAB — C. TRACHOMATIS/N. GONORRHOEAE RNA
C. trachomatis RNA, TMA: NOT DETECTED
N. gonorrhoeae RNA, TMA: NOT DETECTED

## 2020-12-24 ENCOUNTER — Other Ambulatory Visit: Payer: Self-pay

## 2020-12-24 ENCOUNTER — Ambulatory Visit (INDEPENDENT_AMBULATORY_CARE_PROVIDER_SITE_OTHER): Payer: 59 | Admitting: Medical-Surgical

## 2020-12-24 VITALS — BP 103/65 | HR 66 | Ht 67.0 in | Wt 196.0 lb

## 2020-12-24 DIAGNOSIS — Z3042 Encounter for surveillance of injectable contraceptive: Secondary | ICD-10-CM

## 2020-12-24 MED ORDER — MEDROXYPROGESTERONE ACETATE 150 MG/ML IM SUSP
150.0000 mg | Freq: Once | INTRAMUSCULAR | Status: AC
  Administered 2020-12-24: 150 mg via INTRAMUSCULAR

## 2020-12-24 NOTE — Progress Notes (Signed)
   Subjective:    Patient ID: Kimberly Allison, female    DOB: Dec 14, 1990, 30 y.o.   MRN: [id number]  HPI Patient is here for a Depo Provera injection. Denies chest pain, shortness of breath, headaches, mood changes, or problems with medication. Per chart, the last injection was on 10/09/2020. Patient was one day early for next injection. Got approval from Christen Butter, DNP to give early.    Review of Systems     Objective:   Physical Exam        Assessment & Plan:  Injection administered in the left deltoid per patient request. Patient tolerated injection well without complications. Patient given date range for her next injection based on the Depo Calendar handout and sent to front desk to schedule for 12 weeks.

## 2021-01-10 ENCOUNTER — Ambulatory Visit: Payer: 59

## 2021-04-02 ENCOUNTER — Ambulatory Visit (INDEPENDENT_AMBULATORY_CARE_PROVIDER_SITE_OTHER): Payer: 59 | Admitting: Physician Assistant

## 2021-04-02 ENCOUNTER — Other Ambulatory Visit: Payer: Self-pay

## 2021-04-02 ENCOUNTER — Encounter: Payer: Self-pay | Admitting: Physician Assistant

## 2021-04-02 ENCOUNTER — Other Ambulatory Visit (HOSPITAL_COMMUNITY): Payer: Self-pay

## 2021-04-02 VITALS — BP 131/76 | HR 66 | Ht 67.0 in | Wt 197.0 lb

## 2021-04-02 DIAGNOSIS — Z3042 Encounter for surveillance of injectable contraceptive: Secondary | ICD-10-CM | POA: Diagnosis not present

## 2021-04-02 DIAGNOSIS — N898 Other specified noninflammatory disorders of vagina: Secondary | ICD-10-CM | POA: Diagnosis not present

## 2021-04-02 LAB — POCT URINE PREGNANCY: Preg Test, Ur: NEGATIVE

## 2021-04-02 MED ORDER — MEDROXYPROGESTERONE ACETATE 150 MG/ML IM SUSP
150.0000 mg | Freq: Once | INTRAMUSCULAR | Status: AC
  Administered 2021-10-23: 150 mg via INTRAMUSCULAR

## 2021-04-02 MED ORDER — METRONIDAZOLE 500 MG PO TABS: 500.0000 mg | ORAL_TABLET | Freq: Two times a day (BID) | ORAL | 0 refills | Status: AC

## 2021-04-02 MED ORDER — MEDROXYPROGESTERONE ACETATE 150 MG/ML IM SUSP
150.0000 mg | INTRAMUSCULAR | 0 refills | Status: DC
  Filled 2021-04-02: qty 1, 90d supply, fill #0

## 2021-04-02 MED ORDER — MEDROXYPROGESTERONE ACETATE 150 MG/ML IM SUSP
150.0000 mg | Freq: Once | INTRAMUSCULAR | Status: DC
  Administered 2021-04-02: 150 mg via INTRAMUSCULAR

## 2021-04-02 NOTE — Progress Notes (Signed)
Subjective:    Patient ID: Kimberly Allison, female    DOB: 1990/11/25, 30 y.o.   MRN: [id number]  HPI Pt is a 30 yo female with vaginal discharge and odor. She has noticed this discharge on and off since her son 11 years ago but worsened since June. In June she came to clinic and STD testing negative as well as wet prep. Discharge has worsened with more odor. Denies any itching or burning. She is embarrassed to have sex with the discharge. Hx of BV in the past. Denies any abdominal or flank pain.   Needs depo shot.   .. Active Ambulatory Problems    Diagnosis Date Noted   Gastroparesis 04/02/2011   History of cholecystectomy 04/02/2011   Irritable bowel syndrome 04/02/2011   History of cervical dysplasia 04/02/2011   Resolved Ambulatory Problems    Diagnosis Date Noted   No Resolved Ambulatory Problems   Past Medical History:  Diagnosis Date   Abnormal Pap smear of cervix    GERD (gastroesophageal reflux disease)    History of diverticulitis       Review of Systems See HPI.     Objective:   Physical Exam Vitals reviewed.  Constitutional:      Appearance: Normal appearance.  HENT:     Head: Normocephalic.  Cardiovascular:     Rate and Rhythm: Normal rate and regular rhythm.     Pulses: Normal pulses.  Abdominal:     General: There is no distension.     Palpations: There is no mass.     Tenderness: There is no abdominal tenderness. There is no right CVA tenderness, left CVA tenderness, guarding or rebound.  Genitourinary:    General: Normal vulva.     Vagina: Vaginal discharge present.     Comments: Friable cervix with white to clear thin discharge.  No adenxal tenderness.  Neurological:     General: No focal deficit present.     Mental Status: She is alert.      .. Results for orders placed or performed in visit on 04/02/21  POCT urine pregnancy  Result Value Ref Range   Preg Test, Ur Negative Negative       Assessment & Plan:  Marland KitchenMarland KitchenGrenada was  seen today for bacterial vaginosis.  Diagnoses and all orders for this visit:  Vaginal discharge -     SureSwab Advanced Vaginitis, TMA -     metroNIDAZOLE (FLAGYL) 500 MG tablet; Take 1 tablet (500 mg total) by mouth 2 (two) times daily for 7 days.  Encounter for Depo-Provera contraception -     Discontinue: medroxyPROGESTERone (DEPO-PROVERA) injection 150 mg -     POCT urine pregnancy -     Discontinue: medroxyPROGESTERone (DEPO-PROVERA) 150 MG/ML injection; Inject 1 mL (150 mg total) into the muscle every 3 (three) months. -     medroxyPROGESTERone (DEPO-PROVERA) injection 150 mg  Vaginal odor -     SureSwab Advanced Vaginitis, TMA -     metroNIDAZOLE (FLAGYL) 500 MG tablet; Take 1 tablet (500 mg total) by mouth 2 (two) times daily for 7 days.  UPT negative ok for depo shot.   No overt odor but does not look like yeast.  Not sexually active since June when STD testing was done and negative.  11/2020 wet prep was negative.  Empirically treat for BV with metronidazole.  Discussed vaginal pH and use of boric acid for prevention.  Will call with results of sure swab.  Follow up as needed and  if symptoms persist.

## 2021-04-02 NOTE — Patient Instructions (Signed)

## 2021-04-03 ENCOUNTER — Other Ambulatory Visit: Payer: Self-pay | Admitting: Physician Assistant

## 2021-04-03 LAB — SURESWAB® ADVANCED VAGINITIS,TMA
CANDIDA SPECIES: DETECTED — AB
Candida glabrata: NOT DETECTED
SURESWAB(R) ADV BACTERIAL VAGINOSIS(BV),TMA: POSITIVE — AB
TRICHOMONAS VAGINALIS (TV),TMA: NOT DETECTED

## 2021-04-03 MED ORDER — FLUCONAZOLE 150 MG PO TABS: 150.0000 mg | ORAL_TABLET | Freq: Once | ORAL | 0 refills | Status: AC

## 2021-04-03 NOTE — Progress Notes (Signed)
Sure swab showed BV and yeast. Finish metronidazole and sent diflucan x2.

## 2021-10-23 ENCOUNTER — Encounter: Payer: Self-pay | Admitting: Medical-Surgical

## 2021-10-23 ENCOUNTER — Ambulatory Visit: Payer: 59 | Admitting: Medical-Surgical

## 2021-10-23 VITALS — BP 123/79 | HR 67 | Resp 20 | Ht 67.0 in | Wt 198.0 lb

## 2021-10-23 DIAGNOSIS — N898 Other specified noninflammatory disorders of vagina: Secondary | ICD-10-CM | POA: Insufficient documentation

## 2021-10-23 DIAGNOSIS — Z3042 Encounter for surveillance of injectable contraceptive: Secondary | ICD-10-CM | POA: Diagnosis not present

## 2021-10-23 LAB — POCT URINE PREGNANCY: Preg Test, Ur: NEGATIVE

## 2021-10-23 NOTE — Assessment & Plan Note (Signed)
Due for Depo-Provera today.  Injection given with counter no taking return dates for next injection. ?

## 2021-10-23 NOTE — Assessment & Plan Note (Signed)
Sure swab sample collected today.  Incomplete response to over-the-counter Monistat.  Does still have some thick white discharge consistent with yeast however also suspect possible bacterial vaginosis.  Plan to wait for results before treating.  Patient verbalized understanding and is agreeable to the plan. ?

## 2021-10-23 NOTE — Progress Notes (Signed)
? ?Acute Office Visit ? ?Subjective:  ? ?  ?Patient ID: Kimberly Allison, female    DOB: 04-11-1991, 31 y.o.   MRN: [id number] ? ?Chief Complaint  ?Patient presents with  ? Vaginitis  ? ? ?HPI ?Patient is in today for possible yeast infection.  Notes several weeks of vaginal irritation, itching, and discharge.  Originally started out with thick white discharge which she treated with over-the-counter Monistat.  That did resolve some of the redness and irritation but she is left with some mild itching as well as vaginal discharge.  Describes the discharge as thin to thick and white to yellow at times.  Has not been sexually active since August/September of last year and has no concerns for STIs.  Would like to do a sure swab since the last time she came for a wet prep, the results were negative however a recheck with a sure swab showed yeast and BV. ? ?Review of Systems  ?Constitutional:  Negative for chills, fever and malaise/fatigue.  ?Respiratory:  Negative for cough, shortness of breath and wheezing.   ?Cardiovascular:  Negative for chest pain, palpitations and leg swelling.  ?Genitourinary:   ?     Vaginal itching/discharge  ?Neurological:  Negative for dizziness and headaches.  ?Psychiatric/Behavioral:  Negative for depression and suicidal ideas. The patient is not nervous/anxious and does not have insomnia.   ? ? ?   ?Objective:  ?  ?BP 123/79 (BP Location: Right Arm, Cuff Size: Normal)   Pulse 67   Resp 20   Ht 5\' 7"  (1.702 m)   Wt 198 lb (89.8 kg)   SpO2 98%   BMI 31.01 kg/m?  ? ? ?Physical Exam ?Vitals reviewed. Exam conducted with a chaperone present.  ?Constitutional:   ?   General: She is not in acute distress. ?   Appearance: Normal appearance. She is not ill-appearing.  ?HENT:  ?   Head: Normocephalic and atraumatic.  ?Cardiovascular:  ?   Rate and Rhythm: Normal rate and regular rhythm.  ?   Pulses: Normal pulses.  ?   Heart sounds: Normal heart sounds. No murmur heard. ?  No friction rub. No  gallop.  ?Pulmonary:  ?   Effort: Pulmonary effort is normal. No respiratory distress.  ?   Breath sounds: Normal breath sounds. No wheezing.  ?Genitourinary: ?   General: Normal vulva.  ?   Exam position: Lithotomy position.  ?   Pubic Area: No rash or pubic lice.   ?   Labia:     ?   Right: No rash, tenderness, lesion or injury.     ?   Left: No rash, tenderness, lesion or injury.   ?   Vagina: Vaginal discharge (Thin, white discharge with several thick, white chunks near the cervix) and erythema (At the vaginal introitus) present.  ?   Cervix: Normal.  ?   Comments: Sure swab sample collected ?Skin: ?   General: Skin is warm and dry.  ?Neurological:  ?   Mental Status: She is alert and oriented to person, place, and time.  ?Psychiatric:     ?   Mood and Affect: Mood normal.     ?   Behavior: Behavior normal.     ?   Thought Content: Thought content normal.     ?   Judgment: Judgment normal.  ? ?Results for orders placed or performed in visit on 10/23/21  ?POCT urine pregnancy  ?Result Value Ref Range  ? Preg Test, Ur  Negative Negative  ? ?   ?Assessment & Plan:  ? ?Problem List Items Addressed This Visit   ? ?  ? Genitourinary  ? Vaginal itching  ?  Sure swab sample collected today.  Incomplete response to over-the-counter Monistat.  Does still have some thick white discharge consistent with yeast however also suspect possible bacterial vaginosis.  Plan to wait for results before treating.  Patient verbalized understanding and is agreeable to the plan. ? ?  ?  ?  ? Other  ? Vaginal discharge - Primary  ? Relevant Orders  ? SureSwab? Advanced Bacterial Vaginosis (BV), CT/NG, TMA  ? Depo-Provera contraceptive status  ?  Due for Depo-Provera today.  Injection given with counter no taking return dates for next injection. ? ?  ?  ? Relevant Orders  ? POCT urine pregnancy (Completed)  ? ? ?No orders of the defined types were placed in this encounter. ? ? ?Return for Next Depo-Provera injection in 12-14  weeks. ? ?___________________________________________ ?Thayer Ohm, DNP, APRN, FNP-BC ?Primary Care and Sports Medicine ?[facility] ? ? ? ?

## 2021-10-25 ENCOUNTER — Encounter: Payer: Self-pay | Admitting: Medical-Surgical

## 2021-10-25 LAB — SURESWAB® ADVANCED BACTERIAL VAGINOSIS (BV), CT/NG,TMA
C. trachomatis RNA, TMA: NOT DETECTED
N. gonorrhoeae RNA, TMA: NOT DETECTED
SURESWAB(R) ADV BACTERIAL VAGINOSIS(BV),TMA: NEGATIVE

## 2021-10-28 ENCOUNTER — Other Ambulatory Visit (HOSPITAL_COMMUNITY): Payer: Self-pay

## 2021-10-28 MED ORDER — FLUCONAZOLE 150 MG PO TABS
150.0000 mg | ORAL_TABLET | Freq: Once | ORAL | 0 refills | Status: AC
  Filled 2021-10-28: qty 1, 1d supply, fill #0

## 2021-10-28 NOTE — Telephone Encounter (Signed)
She would like the prescription to go to [facility] pharmacy.

## 2022-01-09 ENCOUNTER — Ambulatory Visit (INDEPENDENT_AMBULATORY_CARE_PROVIDER_SITE_OTHER): Payer: 59 | Admitting: Medical-Surgical

## 2022-01-09 VITALS — BP 102/67 | HR 55 | Ht 67.0 in | Wt 195.0 lb

## 2022-01-09 DIAGNOSIS — Z3042 Encounter for surveillance of injectable contraceptive: Secondary | ICD-10-CM | POA: Diagnosis not present

## 2022-01-09 MED ORDER — MEDROXYPROGESTERONE ACETATE 150 MG/ML IM SUSY
PREFILLED_SYRINGE | Freq: Once | INTRAMUSCULAR | Status: AC
  Administered 2022-01-09: 150 mg via INTRAMUSCULAR

## 2022-01-09 NOTE — Progress Notes (Signed)
Pt is here for a depo provera injection. Denies chest pain, SOB, headaches, mood changes, or problems with medication.  Location: RD (per pt)  Pt tolerated injection well w/o complications. Pt advised to schedule next injection in 12 weeks.   Dates: 10/19 - 11/2

## 2022-01-09 NOTE — Progress Notes (Signed)
Agree with documentation as below.  ___________________________________________ Adrine Hayworth L. Torria Fromer, DNP, APRN, FNP-BC Primary Care and Sports Medicine [facility]  
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
## 2022-07-22 ENCOUNTER — Other Ambulatory Visit (HOSPITAL_COMMUNITY): Payer: Self-pay

## 2022-07-22 MED ORDER — FLUCONAZOLE 150 MG PO TABS
150.0000 mg | ORAL_TABLET | Freq: Once | ORAL | 1 refills | Status: AC
  Filled 2022-07-22: qty 1, 1d supply, fill #0

## 2022-07-23 ENCOUNTER — Other Ambulatory Visit (HOSPITAL_COMMUNITY): Payer: Self-pay

## 2022-10-14 ENCOUNTER — Encounter: Payer: Self-pay | Admitting: Medical-Surgical

## 2022-10-14 ENCOUNTER — Ambulatory Visit: Payer: Commercial Managed Care - PPO | Admitting: Medical-Surgical

## 2022-10-14 VITALS — BP 118/75 | HR 64 | Resp 20 | Ht 67.0 in | Wt 215.0 lb

## 2022-10-14 DIAGNOSIS — Z3042 Encounter for surveillance of injectable contraceptive: Secondary | ICD-10-CM

## 2022-10-14 DIAGNOSIS — Z113 Encounter for screening for infections with a predominantly sexual mode of transmission: Secondary | ICD-10-CM | POA: Diagnosis not present

## 2022-10-14 LAB — WET PREP FOR TRICH, YEAST, CLUE
MICRO NUMBER:: 14923721
Specimen Quality: ADEQUATE

## 2022-10-14 LAB — POCT URINE PREGNANCY: Preg Test, Ur: NEGATIVE

## 2022-10-14 MED ORDER — MEDROXYPROGESTERONE ACETATE 150 MG/ML IM SUSY
PREFILLED_SYRINGE | Freq: Once | INTRAMUSCULAR | Status: AC
Start: 2022-10-14 — End: 2022-10-14

## 2022-10-14 NOTE — Progress Notes (Signed)
        Established patient visit  History, exam, impression, and plan:  1. Routine screening for STI (sexually transmitted infection) Pleasant 32 year old female presenting today requesting STI testing.  She is sexually active with 1 new partner.  Reports using condoms but not with every encounter.  Having no symptoms would like to have her testing updated since it has been a couple of years.  Labs ordered. - WET PREP FOR TRICH, YEAST, CLUE - RPR - Hepatitis B surface antigen - Hepatitis C antibody - HIV Antibody (routine testing w rflx) - C. trachomatis/N. gonorrhoeae RNA  2. Encounter for Depo-Provera contraception Previously used Depo-Provera injections for contraception.  Her last injection was in August of last year so she is certainly overdue.  POCT urine pregnancy test negative today.  Restart Depo-Provera injections every 12-14 weeks for contraception.  Recommend using condoms with all sexual encounters to prevent STIs. - POCT urine pregnancy - medroxyPROGESTERone Acetate SUSY   Procedures performed this visit: None.  Return for NV for Depo-Provera in 12-14 weeks.  __________________________________ Thayer Ohm, DNP, APRN, FNP-BC Primary Care and Sports Medicine [facility] [place]

## 2022-10-15 ENCOUNTER — Encounter: Payer: Self-pay | Admitting: Medical-Surgical

## 2022-10-15 ENCOUNTER — Other Ambulatory Visit (HOSPITAL_COMMUNITY): Payer: Self-pay

## 2022-10-15 ENCOUNTER — Other Ambulatory Visit: Payer: Self-pay

## 2022-10-15 LAB — C. TRACHOMATIS/N. GONORRHOEAE RNA
C. trachomatis RNA, TMA: NOT DETECTED
N. gonorrhoeae RNA, TMA: NOT DETECTED

## 2022-10-15 LAB — HEPATITIS B SURFACE ANTIGEN: Hepatitis B Surface Ag: NONREACTIVE

## 2022-10-15 LAB — HIV ANTIBODY (ROUTINE TESTING W REFLEX): HIV 1&2 Ab, 4th Generation: NONREACTIVE

## 2022-10-15 LAB — HEPATITIS C ANTIBODY: Hepatitis C Ab: NONREACTIVE

## 2022-10-15 LAB — RPR: RPR Ser Ql: NONREACTIVE

## 2022-10-15 MED ORDER — METRONIDAZOLE 500 MG PO TABS
500.0000 mg | ORAL_TABLET | Freq: Two times a day (BID) | ORAL | 0 refills | Status: DC
  Filled 2022-10-15: qty 14, 7d supply, fill #0

## 2022-10-15 MED ORDER — METRONIDAZOLE 500 MG PO TABS: 500.0000 mg | ORAL_TABLET | Freq: Two times a day (BID) | ORAL | 0 refills | Status: AC

## 2022-10-15 NOTE — Addendum Note (Signed)
Addended byChristen Butter on: 10/15/2022 07:22 AM   Modules accepted: Orders

## 2023-08-26 ENCOUNTER — Ambulatory Visit: Admitting: Medical-Surgical

## 2023-08-26 ENCOUNTER — Other Ambulatory Visit (HOSPITAL_COMMUNITY)
Admission: RE | Admit: 2023-08-26 | Discharge: 2023-08-26 | Disposition: A | Discharge status: Home / Self Care | Source: Ambulatory Visit | Attending: Medical-Surgical | Admitting: Medical-Surgical

## 2023-08-26 ENCOUNTER — Encounter: Payer: Self-pay | Admitting: Medical-Surgical

## 2023-08-26 VITALS — BP 111/70 | HR 71 | Resp 20 | Ht 67.0 in | Wt 201.0 lb

## 2023-08-26 DIAGNOSIS — Z1159 Encounter for screening for other viral diseases: Secondary | ICD-10-CM | POA: Diagnosis not present

## 2023-08-26 DIAGNOSIS — Z3042 Encounter for surveillance of injectable contraceptive: Secondary | ICD-10-CM

## 2023-08-26 DIAGNOSIS — Z124 Encounter for screening for malignant neoplasm of cervix: Secondary | ICD-10-CM | POA: Insufficient documentation

## 2023-08-26 DIAGNOSIS — Z113 Encounter for screening for infections with a predominantly sexual mode of transmission: Secondary | ICD-10-CM | POA: Insufficient documentation

## 2023-08-26 LAB — POCT URINE PREGNANCY: Preg Test, Ur: NEGATIVE

## 2023-08-26 MED ORDER — MEDROXYPROGESTERONE ACETATE 150 MG/ML IM SUSP
150.0000 mg | INTRAMUSCULAR | Status: AC
  Administered 2023-08-26: 150 mg via INTRAMUSCULAR

## 2023-08-26 NOTE — Progress Notes (Signed)
        Established patient visit  History, exam, impression, and plan:  1. Cervical cancer screening (Primary) Very pleasant 33 year old female presenting today for update of her Pap smear.  Her last 1 was in 2022 with normal results.  Denies any concerning symptoms.  Would like full STI testing today.  Would also like to be tested for BV and yeast.  See physical exam below.  Sending Pap smear sample with HPV cotesting.  If normal, will be due in 5 years.  Adding testing for gonorrhea, chlamydia, and trichomonas to the Pap smear sample.  Wet prep collected to evaluate for BV and yeast. - Cytology - PAP - WET PREP FOR TRICH, YEAST, CLUE  2. Routine screening for STI (sexually transmitted infection) Patient reports that she is sexually active with 1 female partner and they use condoms most of the time.  Denies any concerning symptoms but would like to be tested fully today.  As noted above, Pap smear sample was sent to test for gonorrhea, chlamydia, and trichomonas.  Adding STI profile today. - Cytology - PAP - STI Profile - WET PREP FOR TRICH, YEAST, CLUE  3. Encounter for Depo-Provera contraception Previously used Depo-Provera injections for contraception but her last one was in May 2024.  They have been using condoms for contraception but she would like to get restarted on the Depo shot.  Last menstrual period 08/22/2023.  POCT UPT negative.  Depo-Provera 150 mg IM given in office today.  Will be due for repeat of this in 12-14 weeks. - POCT urine pregnancy - medroxyPROGESTERone (DEPO-PROVERA) injection 150 mg   Procedures performed this visit: None.  Return in about 3 months (around 11/26/2023) for nurse visit for Depo-Provera shot.  __________________________________ Thayer Ohm, DNP, APRN, FNP-BC Primary Care and Sports Medicine [facility] [place]

## 2023-08-27 LAB — STI PROFILE
HCV Ab: NONREACTIVE
HIV Screen 4th Generation wRfx: NONREACTIVE
Hep B Core Total Ab: NEGATIVE
Hep B Surface Ab, Qual: NONREACTIVE
Hepatitis B Surface Ag: NEGATIVE
RPR Ser Ql: NONREACTIVE

## 2023-08-27 LAB — CYTOLOGY - PAP
Chlamydia: NEGATIVE
Comment: NEGATIVE
Comment: NEGATIVE
Comment: NEGATIVE
Comment: NORMAL
Diagnosis: NEGATIVE
High risk HPV: POSITIVE — AB
Neisseria Gonorrhea: NEGATIVE
Trichomonas: NEGATIVE

## 2023-08-27 LAB — WET PREP FOR TRICH, YEAST, CLUE
Clue Cell Exam: NEGATIVE
Trichomonas Exam: NEGATIVE
Yeast Exam: NEGATIVE

## 2023-08-27 LAB — HCV INTERPRETATION

## 2023-08-28 ENCOUNTER — Encounter: Payer: Self-pay | Admitting: Medical-Surgical

## 2023-11-24 ENCOUNTER — Ambulatory Visit (INDEPENDENT_AMBULATORY_CARE_PROVIDER_SITE_OTHER)

## 2023-11-24 VITALS — BP 129/75 | HR 59 | Ht 67.0 in

## 2023-11-24 DIAGNOSIS — Z3042 Encounter for surveillance of injectable contraceptive: Secondary | ICD-10-CM | POA: Diagnosis not present

## 2023-11-24 MED ORDER — MEDROXYPROGESTERONE ACETATE 150 MG/ML IM SUSP
150.0000 mg | Freq: Once | INTRAMUSCULAR | Status: AC
  Administered 2023-11-24: 150 mg via INTRAMUSCULAR

## 2023-11-24 NOTE — Progress Notes (Signed)
   Established Patient Office Visit  Subjective   Patient ID: Kimberly Allison, female    DOB: 1991/02/02  Age: 33 y.o. MRN: [id number]  Chief Complaint  Patient presents with   Depo-Provera  injection     Depo- Provera  injection nurse visit.    HPI  Encounter for Depo-Provera  injection nurse visit. Patient denies any  medication problems or side effects.   ROS    Objective:     BP 129/75   Pulse (!) 59   Ht 5' 7 (1.702 m)   SpO2 100%   BMI 31.48 kg/m    Physical Exam   No results found for any visits on 11/24/23.    The ASCVD Risk score (Arnett DK, et al., 2019) failed to calculate for the following reasons:   The 2019 ASCVD risk score is only valid for ages 66 to 86    Assessment & Plan:  Admin Depo-Provera  150mg  IM  Right Deltoid. Patient tolerated injection well without complications. Patient will return between 02/09/2024 and 02/23/2024 for next nurse visit for Depo-Provera  injection.  Problem List Items Addressed This Visit       Other   Encounter for Depo-Provera  contraception - Primary    Return for Return between 02/09/2024 and 02/23/2024 for next nurse visit for Depo-Provera  injection. Dickie Found, LPN

## 2023-11-24 NOTE — Patient Instructions (Signed)
 Return between 02/09/2024 and 02/23/2024 for next nurse visit for Depo-Provera  injection.

## 2023-11-25 ENCOUNTER — Ambulatory Visit

## 2024-02-09 ENCOUNTER — Ambulatory Visit (INDEPENDENT_AMBULATORY_CARE_PROVIDER_SITE_OTHER)

## 2024-02-09 VITALS — BP 129/77 | Resp 20 | Ht 67.0 in

## 2024-02-09 DIAGNOSIS — Z3042 Encounter for surveillance of injectable contraceptive: Secondary | ICD-10-CM

## 2024-02-09 MED ORDER — MEDROXYPROGESTERONE ACETATE 150 MG/ML IM SUSY
150.0000 mg | PREFILLED_SYRINGE | Freq: Once | INTRAMUSCULAR | Status: AC
  Administered 2024-02-09: 150 mg via INTRAMUSCULAR

## 2024-02-09 NOTE — Progress Notes (Signed)
   Established Patient Office Visit  Subjective   Patient ID: Kimberly Allison, female    DOB: 1991/03/23  Age: 33 y.o. MRN: [id number]  Chief Complaint  Patient presents with   Contraception    Depo-provera  injection    HPI  Patient is here for Depo-Provera  Injection. Denies chest pain, shortness of breath, headaches, mood changes, or problems with medication.   ROS    Objective:     BP 129/77   Resp 20   Ht 5' 7 (1.702 m)   SpO2 99%   BMI 31.48 kg/m    Physical Exam   No results found for any visits on 02/09/24.    The ASCVD Risk score (Arnett DK, et al., 2019) failed to calculate for the following reasons:   The 2019 ASCVD risk score is only valid for ages 75 to 55    Assessment & Plan:  Injection administered in the left deltoid. Patient tolerated well without complications. Patient advised to schedule next injection in 12 weeks.  Problem List Items Addressed This Visit       Other   Encounter for Depo-Provera  contraception - Primary    Return for Return between Nov 18th - Dec 2nd.    Arnulfo LITTIE Meo, CMA

## 2024-02-09 NOTE — Progress Notes (Deleted)
 Patient is here for Depo-Provera  Injection. Denies chest pain, shortness of breath, headaches, mood changes, or problems with medication. Injection administered in the left deltoid. Patient tolerated well without complications. Patient advised to schedule next injection in 12 weeks.

## 2024-02-09 NOTE — Patient Instructions (Signed)
 Return between Nov 18th - Dec 2nd

## 2024-04-25 NOTE — Progress Notes (Unsigned)
   Subjective:    Patient ID: Kimberly Allison, female    DOB: 1991-05-01, 33 y.o.   MRN: [id number]  HPI  Patient is here for a depo-provera  injection. Denies CP, SOB, headaches, mood changes, or problems with medication.  Review of Systems     Objective:   Physical Exam        Assessment & Plan:   Patient given injection in her RD. Patient tolerated well no redness or swelling noted at the site. Patient advised to schedule next injection between 11-13 weeks (around feb. 3rd -feb.17th)

## 2024-04-26 ENCOUNTER — Ambulatory Visit (INDEPENDENT_AMBULATORY_CARE_PROVIDER_SITE_OTHER)

## 2024-04-26 VITALS — BP 126/80 | HR 65 | Ht 67.0 in

## 2024-04-26 DIAGNOSIS — Z3042 Encounter for surveillance of injectable contraceptive: Secondary | ICD-10-CM

## 2024-04-26 DIAGNOSIS — N760 Acute vaginitis: Secondary | ICD-10-CM

## 2024-04-26 DIAGNOSIS — Z113 Encounter for screening for infections with a predominantly sexual mode of transmission: Secondary | ICD-10-CM

## 2024-04-26 MED ORDER — MEDROXYPROGESTERONE ACETATE 150 MG/ML IM SUSP
150.0000 mg | Freq: Once | INTRAMUSCULAR | Status: AC
  Administered 2024-04-26: 150 mg via INTRAMUSCULAR

## 2024-04-26 NOTE — Patient Instructions (Signed)
 Return between Jul 13, 2023 and Jul 27, 2023 for nurse visit for next depoprovera injection.

## 2024-04-26 NOTE — Progress Notes (Signed)
   Established Patient Office Visit  Subjective   Patient ID: Kimberly Allison, female    DOB: 1991-05-05  Age: 33 y.o. MRN: [id number]  Chief Complaint  Patient presents with   encounter for Depoprovera contraception    Nurse visit for Depoprovera injection    HPI  Encounter for Depoprovera contraception - nurse visit for Depoprovera injection- patient requesting BV and STI checking . No symptoms of STI - but feels may have BV due to symptoms of vaginitis.   ROS    Objective:     BP 126/80   Pulse 65   Ht 5' 7 (1.702 m)   SpO2 99%   BMI 31.48 kg/m    Physical Exam   No results found for any visits on 04/26/24.    The ASCVD Risk score (Arnett DK, et al., 2019) failed to calculate for the following reasons:   The 2019 ASCVD risk score is only valid for ages 67 to 72    Assessment & Plan:  Depoprovera injection 150mg   admin IM left deltoid. ( Patient requested injection into left deltoid) patient tolerated injection well without complications . Patient will return between Feb 3 , 2025 and Jul 27, 2023 for next Depoprovera injection.  Per Zada Palin, NP approved collection for requested testing. Patient collected self vaginal swab and urine to be sent to  lab  to check for BV, Trich, clue cells,  Gonorrhea and chlamydia- patient was offered blood work to check for more extensive STI tests but patient declined.  Problem List Items Addressed This Visit       Other   Encounter for Depo-Provera  contraception   Other Visit Diagnoses       Routine screening for STI (sexually transmitted infection)    -  Primary   Relevant Orders   WET PREP FOR TRICH, YEAST, CLUE   GC/Chlamydia Probe Amp([facility])     Other vaginitis           Return for between Jul 13, 2023 and Jul 27, 2023 for next depoprovera inejction.    Suzen SHAUNNA Plenty, LPN

## 2024-04-27 ENCOUNTER — Ambulatory Visit: Payer: Self-pay | Admitting: Medical-Surgical

## 2024-04-27 LAB — WET PREP FOR TRICH, YEAST, CLUE
Clue Cell Exam: NEGATIVE
Trichomonas Exam: NEGATIVE
Yeast Exam: NEGATIVE

## 2024-04-28 LAB — GC/CHLAMYDIA PROBE AMP
Chlamydia trachomatis, NAA: NEGATIVE
Neisseria Gonorrhoeae by PCR: NEGATIVE

## 2024-05-02 ENCOUNTER — Ambulatory Visit: Admitting: Medical-Surgical

## 2024-05-02 ENCOUNTER — Ambulatory Visit: Payer: Self-pay | Admitting: Medical-Surgical

## 2024-05-02 ENCOUNTER — Ambulatory Visit (INDEPENDENT_AMBULATORY_CARE_PROVIDER_SITE_OTHER)

## 2024-05-02 ENCOUNTER — Encounter: Payer: Self-pay | Admitting: Medical-Surgical

## 2024-05-02 VITALS — BP 139/82 | HR 56 | Resp 20 | Ht 67.0 in | Wt 222.4 lb

## 2024-05-02 DIAGNOSIS — S6992XA Unspecified injury of left wrist, hand and finger(s), initial encounter: Secondary | ICD-10-CM

## 2024-05-02 DIAGNOSIS — S62629A Displaced fracture of medial phalanx of unspecified finger, initial encounter for closed fracture: Secondary | ICD-10-CM

## 2024-05-02 DIAGNOSIS — S62625A Displaced fracture of medial phalanx of left ring finger, initial encounter for closed fracture: Secondary | ICD-10-CM | POA: Diagnosis not present

## 2024-05-02 NOTE — Progress Notes (Signed)
        Established patient visit   History of Present Illness   Discussed the use of AI scribe software for clinical note transcription with the patient, who gave verbal consent to proceed.  History of Present Illness   Kimberly Allison is a 33 year old female who presents with hand pain and numbness following an injury.  Hand pain and numbness - Onset after striking hand against a doorframe or brick wall last night when trying to stop the dog from getting out of the house - Pain localized at the site of impact, radiating up the hand - Pain severity rated as 4 out of 10 - Movement exacerbates pain and tingling - Numbness and tingling present in the middle finger and pinky - Difficulty moving fingers, particularly the ring finger - No medication or ice applied since the incident   Physical Exam   Physical Exam Vitals reviewed.  Constitutional:      General: She is not in acute distress.    Appearance: Normal appearance. She is not ill-appearing.  HENT:     Head: Normocephalic and atraumatic.  Cardiovascular:     Rate and Rhythm: Normal rate and regular rhythm.  Pulmonary:     Effort: Pulmonary effort is normal. No respiratory distress.     Breath sounds: Normal breath sounds.  Musculoskeletal:     Comments: Left 4th finger swelling with PIP joint bruising and tenderness. Limited ROM of 4th finger on both flexion and extension.  Skin:    General: Skin is warm and dry.  Neurological:     Mental Status: She is alert and oriented to person, place, and time.  Psychiatric:        Mood and Affect: Mood normal.        Behavior: Behavior normal.        Thought Content: Thought content normal.        Judgment: Judgment normal.    Assessment & Plan   Closed avulsion fracture of middle phalanx of finger, initial encounter  Acute injury with pain, numbness, and tingling of the left 3rd-5th digits. - Ordered x-ray of the left wrist and hand revealing avulsion fracture. -  Finger splint applied, secured with Coban. - Use ice and acetaminophen  or ibuprofen for pain. - Offered additional analgesia, declined today. - Continue splinting for four weeks then return for reevaluation.  Follow up   Return if symptoms worsen or fail to improve. __________________________________ Kimberly FREDRIK Palin, DNP, APRN, FNP-BC Primary Care and Sports Medicine [facility] [place]

## 2024-05-30 ENCOUNTER — Encounter: Payer: Self-pay | Admitting: Medical-Surgical

## 2024-05-30 ENCOUNTER — Ambulatory Visit: Admitting: Medical-Surgical

## 2024-05-30 VITALS — BP 127/84 | HR 86 | Temp 98.4°F | Resp 16 | Ht 67.0 in | Wt 222.0 lb

## 2024-05-30 DIAGNOSIS — S62629A Displaced fracture of medial phalanx of unspecified finger, initial encounter for closed fracture: Secondary | ICD-10-CM

## 2024-05-30 NOTE — Progress Notes (Signed)
" ° °  Established Patient Office Visit  Subjective   Patient ID: Kimberly Allison, female    DOB: 1990/12/26  Age: 33 y.o. MRN: [id number]  Chief Complaint  Patient presents with   Medical Management of Chronic Issues    (L) middle finger fracture follow up    HPI  33 year old female presents for 4 week follow up on 4th digit fracture on left hand. Patient states that she has been in a splint for 4 weeks. States that she only did ice and ibuprofen for the first week. She reports continued swelling, pain, and immobility when she takes splint off.   Review of Systems  Constitutional: Negative.   HENT: Negative.    Eyes: Negative.   Respiratory: Negative.    Cardiovascular: Negative.   Gastrointestinal: Negative.   Genitourinary: Negative.   Musculoskeletal:  Positive for joint pain.       Fourth finger pain and swelling on left hand  Skin: Negative.   Neurological: Negative.   Endo/Heme/Allergies: Negative.   Psychiatric/Behavioral: Negative.        Objective:     BP 127/84 (BP Location: Left Arm, Patient Position: Sitting, Cuff Size: Normal)   Pulse 86   Temp 98.4 F (36.9 C) (Oral)   Resp 16   Ht 5' 7 (1.702 m)   Wt 100.7 kg   SpO2 99%   BMI 34.78 kg/m  BP Readings from Last 3 Encounters:  05/30/24 127/84  05/02/24 139/82  04/26/24 126/80      Physical Exam Vitals and nursing note reviewed.  Constitutional:      General: She is not in acute distress.    Appearance: Normal appearance.  Cardiovascular:     Rate and Rhythm: Normal rate and regular rhythm.     Pulses: Normal pulses.     Heart sounds: Normal heart sounds.  Pulmonary:     Effort: Pulmonary effort is normal.     Breath sounds: Normal breath sounds.  Musculoskeletal:     Comments: Splinting of left fourth digit. Bruising and swelling noted  Neurological:     General: No focal deficit present.     Mental Status: She is alert and oriented to person, place, and time.  Psychiatric:         Mood and Affect: Mood normal.        Behavior: Behavior normal.        Thought Content: Thought content normal.        Judgment: Judgment normal.     No results found for any visits on 05/30/24.   The ASCVD Risk score (Arnett DK, et al., 2019) failed to calculate for the following reasons:   The 2019 ASCVD risk score is only valid for ages 79 to 77   * - Cholesterol units were assumed    Assessment & Plan:   1. Closed avulsion fracture of middle phalanx of finger, initial encounter (Primary) -Recommended return to icing and ibuprofen as needed for pain -Continue finger splinting -Referral to Dr. Camella    Return if symptoms worsen or fail to improve.    Derrek JINNY Freund, NP Student  "

## 2024-05-30 NOTE — Progress Notes (Signed)
 Work note provided at patient request due to the need for continued splinting.  Medical screening examination/treatment was performed by qualified clinical staff member and as supervising provider I was immediately available for consultation/collaboration. I have reviewed documentation and agree with assessment and plan.  Zada FREDRIK Palin, DNP, APRN, FNP-BC [facility] and Sports Medicine

## 2024-06-06 DIAGNOSIS — M79645 Pain in left finger(s): Secondary | ICD-10-CM | POA: Diagnosis not present

## 2024-06-14 ENCOUNTER — Encounter: Payer: Self-pay | Admitting: Medical-Surgical

## 2024-06-14 ENCOUNTER — Ambulatory Visit: Admitting: Medical-Surgical

## 2024-06-14 VITALS — BP 113/76 | HR 90 | Temp 97.5°F | Resp 16 | Ht 67.0 in | Wt 223.1 lb

## 2024-06-14 DIAGNOSIS — N760 Acute vaginitis: Secondary | ICD-10-CM

## 2024-06-14 LAB — POCT URINALYSIS DIP (CLINITEK)
Bilirubin, UA: NEGATIVE
Glucose, UA: NEGATIVE mg/dL
Ketones, POC UA: NEGATIVE mg/dL
Nitrite, UA: NEGATIVE
POC PROTEIN,UA: NEGATIVE
Spec Grav, UA: >=1.03 — AB
Urobilinogen, UA: 0.2 U/dL
pH, UA: 5.5

## 2024-06-14 NOTE — Addendum Note (Signed)
 Addended by: FANNY NIELS CROME on: 06/14/2024 02:48 PM   Modules accepted: Orders

## 2024-06-14 NOTE — Progress Notes (Signed)
" ° °       Established patient visit   History of Present Illness   Discussed the use of AI scribe software for clinical note transcription with the patient, who gave verbal consent to proceed.  History of Present Illness   Kimberly Allison is a 34 year old female with a history of bacterial vaginosis who presents with vaginal itching and discharge.  Vaginal pruritus and discharge - Onset since Saturday (4 days duration) - Marked vaginal itching and irritation, partially improved but persistent - Thin, white to slightly yellow vaginal discharge - Mild fishy odor present - No dysuria, urinary frequency, urgency, fever, chills, or dyspareunia - No treatment attempted for current episode  Sexual activity and contraception - Sexually active with one female partner - Inconsistent condom use  Physical Exam   Physical Exam Vitals reviewed.  Constitutional:      General: She is not in acute distress.    Appearance: Normal appearance.  HENT:     Head: Normocephalic and atraumatic.  Cardiovascular:     Rate and Rhythm: Normal rate and regular rhythm.     Pulses: Normal pulses.     Heart sounds: Normal heart sounds. No murmur heard.    No friction rub. No gallop.  Pulmonary:     Effort: Pulmonary effort is normal. No respiratory distress.     Breath sounds: Normal breath sounds. No wheezing.  Skin:    General: Skin is warm and dry.  Neurological:     Mental Status: She is alert and oriented to person, place, and time.  Psychiatric:        Mood and Affect: Mood normal.        Behavior: Behavior normal.        Thought Content: Thought content normal.        Judgment: Judgment normal.    Assessment & Plan   Acute vaginitis Symptoms suggest bacterial vaginosis. Differential includes yeast infection. Previous BV treated successfully. Discussed potential sexual transmission and partner treatment. Reviewed Flagyl  and Diflucan  options. - Sent wet prep. - POCT UA + small leukocytes  and elevated specific gravity; sending for culture  - Prescribe vaginal Flagyl  for five nights if BV confirmed. - Prescribe Diflucan  if yeast infection confirmed. - Advised use of Vagisil for symptom relief until results available.    Follow up   Return if symptoms worsen or fail to improve. __________________________________ Zada FREDRIK Palin, DNP, APRN, FNP-BC Primary Care and Sports Medicine [facility] [place] "

## 2024-06-15 ENCOUNTER — Ambulatory Visit: Payer: Self-pay | Admitting: Medical-Surgical

## 2024-06-15 LAB — WET PREP FOR TRICH, YEAST, CLUE
Clue Cell Exam: NEGATIVE
Trichomonas Exam: NEGATIVE
Yeast Exam: NEGATIVE

## 2024-06-16 LAB — URINE CULTURE

## 2024-07-12 ENCOUNTER — Ambulatory Visit

## 2024-07-12 VITALS — BP 137/85 | HR 67 | Ht 67.0 in

## 2024-07-12 DIAGNOSIS — Z3042 Encounter for surveillance of injectable contraceptive: Secondary | ICD-10-CM

## 2024-07-12 MED ORDER — MEDROXYPROGESTERONE ACETATE 150 MG/ML IM SUSP
150.0000 mg | Freq: Once | INTRAMUSCULAR | Status: AC
  Administered 2024-07-12: 150 mg via INTRAMUSCULAR

## 2024-07-12 NOTE — Progress Notes (Signed)
" ° °  Subjective:    Patient ID: Kimberly Allison, female    DOB: 07-Sep-1990, 34 y.o.   MRN: [id number]  HPI  Patient is here for Depo-Provera  Injection. Denies chest pain, shortness of breath, headaches, mood changes, or problems with medication.   Review of Systems     Objective:   Physical Exam        Assessment & Plan:   Patient given injection in her RD. tolerated well no redness or swelling noted at the injection site. Patient advised to RTC in (11-13 weeks around 09/27/24-10/11/24) "

## 2024-07-12 NOTE — Progress Notes (Signed)
" ° °  Established Patient Office Visit  Subjective   Patient ID: Kimberly Allison, female    DOB: 1990/07/24  Age: 34 y.o. MRN: [id number]  Chief Complaint  Patient presents with   Encounter for Depo-Provera  contraception    Depo-provera  injection - nurse visit=kph    HPI  Encounter for Depo-provera  contraception - Depo-Provera  injection nurse visit. Patient last injection was given 04/26/2024. Patient doing well with injections -denies any side effects or complaints.   ROS    Objective:     BP 137/85 (BP Location: Left Arm, Patient Position: Sitting, Cuff Size: Normal)   Pulse 67   Ht 5' 7 (1.702 m)   SpO2 99%   BMI 34.95 kg/m    Physical Exam   No results found for any visits on 07/12/24.    The ASCVD Risk score (Arnett DK, et al., 2019) failed to calculate for the following reasons:   The 2019 ASCVD risk score is only valid for ages 63 to 73   * - Cholesterol units were assumed    Assessment & Plan:  Depo-Provera   150mg  admin IM Right Deltoid. Patient tolerated injection well and will return between April 21-May 5 for next Depo-Provera  injection as nurse visit.  Problem List Items Addressed This Visit       Other   Encounter for Depo-Provera  contraception - Primary    Return for Return between April 21-Oct 11, 2024 for next Depo-Provera  injection as nurse visit. SABRA Suzen SHAUNNA Alpheus, LPN  "

## 2024-09-27 ENCOUNTER — Ambulatory Visit
